# Patient Record
Sex: Male | Born: 2000
Health system: Southern US, Community
[De-identification: ages and names within clinical notes are randomized; demographics above are authoritative.]

---
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
## 2021-11-02 ENCOUNTER — Encounter: Payer: Self-pay | Admitting: Family Medicine

## 2021-11-02 ENCOUNTER — Ambulatory Visit (INDEPENDENT_AMBULATORY_CARE_PROVIDER_SITE_OTHER): Payer: 59 | Admitting: Family Medicine

## 2021-11-02 VITALS — BP 98/68 | HR 82 | Temp 98.0°F | Ht 70.0 in | Wt 130.1 lb

## 2021-11-02 DIAGNOSIS — Z0000 Encounter for general adult medical examination without abnormal findings: Secondary | ICD-10-CM | POA: Diagnosis not present

## 2021-11-02 LAB — CBC WITH DIFFERENTIAL/PLATELET
Basophils Absolute: 0 10*3/uL (ref 0.0–0.1)
Basophils Relative: 0.4 % (ref 0.0–3.0)
Eosinophils Absolute: 0.1 10*3/uL (ref 0.0–0.7)
Eosinophils Relative: 1.3 % (ref 0.0–5.0)
HCT: 45.8 % (ref 36.0–49.0)
Hemoglobin: 14.9 g/dL (ref 12.0–16.0)
Lymphocytes Relative: 53.6 % — ABNORMAL HIGH (ref 24.0–48.0)
Lymphs Abs: 2.4 10*3/uL (ref 0.7–4.0)
MCHC: 32.5 g/dL (ref 31.0–37.0)
MCV: 82.6 fl (ref 78.0–98.0)
Monocytes Absolute: 0.6 10*3/uL (ref 0.1–1.0)
Monocytes Relative: 13.4 % — ABNORMAL HIGH (ref 3.0–12.0)
Neutro Abs: 1.4 10*3/uL (ref 1.4–7.7)
Neutrophils Relative %: 31.3 % — ABNORMAL LOW (ref 43.0–71.0)
Platelets: 219 10*3/uL (ref 150.0–575.0)
RBC: 5.55 Mil/uL (ref 3.80–5.70)
RDW: 13.8 % (ref 11.4–15.5)
WBC: 4.4 10*3/uL — ABNORMAL LOW (ref 4.5–13.5)

## 2021-11-02 LAB — HEPATIC FUNCTION PANEL
ALT: 21 U/L (ref 0–53)
AST: 28 U/L (ref 0–37)
Albumin: 4.7 g/dL (ref 3.5–5.2)
Alkaline Phosphatase: 102 U/L (ref 52–171)
Bilirubin, Direct: 0.1 mg/dL (ref 0.0–0.3)
Total Bilirubin: 0.6 mg/dL (ref 0.2–1.2)
Total Protein: 7.9 g/dL (ref 6.0–8.3)

## 2021-11-02 LAB — BASIC METABOLIC PANEL
BUN: 13 mg/dL (ref 6–23)
CO2: 27 mEq/L (ref 19–32)
Calcium: 9.8 mg/dL (ref 8.4–10.5)
Chloride: 104 mEq/L (ref 96–112)
Creatinine, Ser: 0.99 mg/dL (ref 0.40–1.50)
GFR: 110.4 mL/min (ref >60.00)
Glucose, Bld: 79 mg/dL (ref 70–99)
Potassium: 4.2 mEq/L (ref 3.5–5.1)
Sodium: 139 mEq/L (ref 135–145)

## 2021-11-02 LAB — LIPID PANEL
Cholesterol: 170 mg/dL (ref 0–200)
HDL: 44.4 mg/dL (ref >39.00)
LDL Cholesterol: 112 mg/dL — ABNORMAL HIGH (ref 0–99)
NonHDL: 125.42
Total CHOL/HDL Ratio: 4
Triglycerides: 67 mg/dL (ref 0.0–149.0)
VLDL: 13.4 mg/dL (ref 0.0–40.0)

## 2021-11-02 LAB — TSH: TSH: 1.82 u[IU]/mL (ref 0.40–5.00)

## 2021-11-02 LAB — HEMOGLOBIN A1C: Hgb A1c MFr Bld: 5.8 % (ref 4.6–6.5)

## 2021-11-02 NOTE — Progress Notes (Signed)
   Subjective:    Patient ID: Jay Rollins, male    DOB: Jan 18, 2001, 21 y.o.   MRN: [id number]  HPI Here for a well exam. He os doing well. He is home for the summer. He attends college in [place] state.   Review of Systems  Constitutional: Negative.   HENT: Negative.    Eyes: Negative.   Respiratory: Negative.    Cardiovascular: Negative.   Gastrointestinal: Negative.   Genitourinary: Negative.   Musculoskeletal: Negative.   Skin: Negative.   Neurological: Negative.   Psychiatric/Behavioral: Negative.         Objective:   Physical Exam Constitutional:      General: He is not in acute distress.    Appearance: Normal appearance. He is well-developed. He is not diaphoretic.  HENT:     Head: Normocephalic and atraumatic.     Right Ear: External ear normal.     Left Ear: External ear normal.     Nose: Nose normal.     Mouth/Throat:     Pharynx: No oropharyngeal exudate.  Eyes:     General: No scleral icterus.       Right eye: No discharge.        Left eye: No discharge.     Conjunctiva/sclera: Conjunctivae normal.     Pupils: Pupils are equal, round, and reactive to light.  Neck:     Thyroid: No thyromegaly.     Vascular: No JVD.     Trachea: No tracheal deviation.  Cardiovascular:     Rate and Rhythm: Normal rate and regular rhythm.     Heart sounds: Normal heart sounds. No murmur heard.    No friction rub. No gallop.  Pulmonary:     Effort: Pulmonary effort is normal. No respiratory distress.     Breath sounds: Normal breath sounds. No wheezing or rales.  Chest:     Chest wall: No tenderness.  Abdominal:     General: Bowel sounds are normal. There is no distension.     Palpations: Abdomen is soft. There is no mass.     Tenderness: There is no abdominal tenderness. There is no guarding or rebound.  Genitourinary:    Penis: Normal. No tenderness.      Testes: Normal.  Musculoskeletal:        General: No tenderness. Normal range of motion.      Cervical back: Neck supple.  Lymphadenopathy:     Cervical: No cervical adenopathy.  Skin:    General: Skin is warm and dry.     Coloration: Skin is not pale.     Findings: No erythema or rash.  Neurological:     Mental Status: He is alert and oriented to person, place, and time.     Cranial Nerves: No cranial nerve deficit.     Motor: No abnormal muscle tone.     Coordination: Coordination normal.     Deep Tendon Reflexes: Reflexes are normal and symmetric. Reflexes normal.  Psychiatric:        Behavior: Behavior normal.        Thought Content: Thought content normal.        Judgment: Judgment normal.           Assessment & Plan:  Well exam. We discussed diet an exercise. Get fasting labs. Gershon Crane, MD

## 2021-12-29 DIAGNOSIS — H5213 Myopia, bilateral: Secondary | ICD-10-CM | POA: Diagnosis not present

## 2022-01-08 ENCOUNTER — Ambulatory Visit: Payer: 59 | Admitting: Family Medicine

## 2022-04-26 ENCOUNTER — Other Ambulatory Visit (HOSPITAL_COMMUNITY): Payer: Self-pay

## 2022-04-26 ENCOUNTER — Ambulatory Visit: Payer: 59 | Admitting: Family Medicine

## 2022-04-26 VITALS — BP 118/76 | HR 70 | Temp 97.8°F | Wt 131.6 lb

## 2022-04-26 DIAGNOSIS — H6123 Impacted cerumen, bilateral: Secondary | ICD-10-CM

## 2022-04-26 DIAGNOSIS — H6122 Impacted cerumen, left ear: Secondary | ICD-10-CM

## 2022-04-26 DIAGNOSIS — H9202 Otalgia, left ear: Secondary | ICD-10-CM

## 2022-04-26 MED ORDER — ACETIC ACID 2 % OT SOLN
6.0000 [drp] | OTIC | 0 refills | Status: AC
  Filled 2022-04-26: qty 15, 3d supply, fill #0

## 2022-04-26 NOTE — Progress Notes (Unsigned)
   Assessment/Plan:   Problem List Items Addressed This Visit   None      Subjective:  HPI:  Jay Rollins is a 21 y.o. male who has ALLERGY, ENVIRONMENTAL; Abdominal pain, chronic, epigastric; and Osgood-Schlatter's disease, left on their problem list..   He  has no past medical history on file.Jay Rollins   He presents with chief complaint of Ear Pain (Left ear discomfort and muffled hearing x 2 days. ) .   Ear Pain: Patient presents with {left/right/bi:30031} ear pain.  Symptoms include {otitis symptoms:327}. Symptoms began {numbers; 0-10:33138} {unit:11} ago and are {course:17::"unchanged"} since that time. Patient denies {respiratory symptoms:16811}. Ear history: {numbers; 0-10:33138} previous ear infections.  Idea having the headaches with that her skin is only here  No past surgical history on file.  Outpatient Medications Prior to Visit  Medication Sig Dispense Refill   Multiple Vitamin (MULTIVITAMIN) capsule Take 1 capsule by mouth daily.     No facility-administered medications prior to visit.    Family History  Problem Relation Age of Onset   Hyperlipidemia Other    Hypertension Other     Social History   Socioeconomic History   Marital status: Single    Spouse name: Not on file   Number of children: Not on file   Years of education: Not on file   Highest education level: Not on file  Occupational History   Not on file  Tobacco Use   Smoking status: Never   Smokeless tobacco: Never  Substance and Sexual Activity   Alcohol use: Not on file   Drug use: Not on file   Sexual activity: Not on file  Other Topics Concern   Not on file  Social History Narrative   Not on file   Social Determinants of Health   Financial Resource Strain: Not on file  Food Insecurity: Not on file  Transportation Needs: Not on file  Physical Activity: Not on file  Stress: Not on file  Social Connections: Not on file  Intimate Partner Violence: Not on file                                                                                                  Objective:  Physical Exam: BP 118/76 (BP Location: Left Arm, Patient Position: Sitting, Cuff Size: Large)   Pulse 70   Temp 97.8 F (36.6 C) (Temporal)   Wt 131 lb 9.6 oz (59.7 kg)   SpO2 99%   BMI 18.88 kg/m    ***General: No acute distress. Awake and conversant.  Eyes: Normal conjunctiva, anicteric. Round symmetric pupils.  ENT: Hearing grossly intact. No nasal discharge.  Neck: Neck is supple. No masses or thyromegaly.  Respiratory: Respirations are non-labored. No auditory wheezing.  Skin: Warm. No rashes or ulcers.  Psych: Alert and oriented. Cooperative, Appropriate mood and affect, Normal judgment.  CV: No cyanosis or JVD MSK: Normal ambulation. No clubbing  Neuro: Sensation and CN II-XII grossly normal.        Garner Nash, MD, MS

## 2022-05-10 ENCOUNTER — Other Ambulatory Visit (HOSPITAL_COMMUNITY): Payer: Self-pay

## 2022-11-19 ENCOUNTER — Encounter: Payer: Commercial Managed Care - PPO | Admitting: Family Medicine

## 2022-11-28 ENCOUNTER — Encounter: Payer: Self-pay | Admitting: Family Medicine

## 2022-11-28 ENCOUNTER — Ambulatory Visit (INDEPENDENT_AMBULATORY_CARE_PROVIDER_SITE_OTHER): Payer: Commercial Managed Care - PPO | Admitting: Family Medicine

## 2022-11-28 VITALS — BP 98/62 | HR 78 | Temp 98.1°F | Ht 69.5 in | Wt 125.0 lb

## 2022-11-28 DIAGNOSIS — K589 Irritable bowel syndrome without diarrhea: Secondary | ICD-10-CM | POA: Insufficient documentation

## 2022-11-28 DIAGNOSIS — Z0000 Encounter for general adult medical examination without abnormal findings: Secondary | ICD-10-CM | POA: Diagnosis not present

## 2022-11-28 LAB — LIPID PANEL
Cholesterol: 288 mg/dL — ABNORMAL HIGH (ref 0–200)
HDL: 43.7 mg/dL (ref >39.00)
LDL Cholesterol: 231 mg/dL — ABNORMAL HIGH (ref 0–99)
NonHDL: 244.36
Total CHOL/HDL Ratio: 7
Triglycerides: 68 mg/dL (ref 0.0–149.0)
VLDL: 13.6 mg/dL (ref 0.0–40.0)

## 2022-11-28 LAB — TSH: TSH: 1.6 u[IU]/mL (ref 0.35–5.50)

## 2022-11-28 LAB — CBC WITH DIFFERENTIAL/PLATELET
Basophils Absolute: 0 10*3/uL (ref 0.0–0.1)
Basophils Relative: 0.5 % (ref 0.0–3.0)
Eosinophils Absolute: 0.1 10*3/uL (ref 0.0–0.7)
Eosinophils Relative: 1.6 % (ref 0.0–5.0)
HCT: 45 % (ref 39.0–52.0)
Hemoglobin: 14.7 g/dL (ref 13.0–17.0)
Lymphocytes Relative: 56.5 % — ABNORMAL HIGH (ref 12.0–46.0)
Lymphs Abs: 2.6 10*3/uL (ref 0.7–4.0)
MCHC: 32.6 g/dL (ref 30.0–36.0)
MCV: 81.6 fl (ref 78.0–100.0)
Monocytes Absolute: 0.2 10*3/uL (ref 0.1–1.0)
Monocytes Relative: 5.4 % (ref 3.0–12.0)
Neutro Abs: 1.6 10*3/uL (ref 1.4–7.7)
Neutrophils Relative %: 36 % — ABNORMAL LOW (ref 43.0–77.0)
Platelets: 222 10*3/uL (ref 150.0–400.0)
RBC: 5.52 Mil/uL (ref 4.22–5.81)
RDW: 13.1 % (ref 11.5–14.6)
WBC: 4.6 10*3/uL (ref 4.5–10.5)

## 2022-11-28 LAB — BASIC METABOLIC PANEL
BUN: 18 mg/dL (ref 6–23)
CO2: 25 mEq/L (ref 19–32)
Calcium: 10.4 mg/dL (ref 8.4–10.5)
Chloride: 106 mEq/L (ref 96–112)
Creatinine, Ser: 0.99 mg/dL (ref 0.40–1.50)
GFR: 109.57 mL/min (ref >60.00)
Glucose, Bld: 92 mg/dL (ref 70–99)
Potassium: 4.1 mEq/L (ref 3.5–5.1)
Sodium: 139 mEq/L (ref 135–145)

## 2022-11-28 LAB — HEMOGLOBIN A1C: Hgb A1c MFr Bld: 5.9 % (ref 4.6–6.5)

## 2022-11-28 LAB — HEPATIC FUNCTION PANEL
ALT: 18 U/L (ref 0–53)
AST: 18 U/L (ref 0–37)
Albumin: 4.9 g/dL (ref 3.5–5.2)
Alkaline Phosphatase: 57 U/L (ref 39–117)
Bilirubin, Direct: 0.1 mg/dL (ref 0.0–0.3)
Total Bilirubin: 0.4 mg/dL (ref 0.2–1.2)
Total Protein: 8 g/dL (ref 6.0–8.3)

## 2022-11-28 NOTE — Progress Notes (Signed)
Subjective:    Patient ID: Jay Rollins, male    DOB: 2001-05-18, 21 y.o.   MRN: [id number]  HPI Here for a well exam. He is spending the summer with his parents. 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 His only complaint is GI issues that started about 3 months ago. He often feels bloated after he eats a meal, and he passes a lot of gas per rectum. No heartburn or nausea or pain. He averages 1-2 BM's every day.    Review of Systems  Constitutional: Negative.   HENT: Negative.    Eyes: Negative.   Respiratory: Negative.    Cardiovascular: Negative.   Gastrointestinal:  Positive for abdominal distention. Negative for abdominal pain, blood in stool, constipation, diarrhea, nausea and vomiting.  Genitourinary: Negative.   Musculoskeletal: Negative.   Skin: Negative.   Neurological: Negative.   Psychiatric/Behavioral: Negative.         Objective:   Physical Exam Constitutional:      General: He is not in acute distress.    Appearance: Normal appearance. He is well-developed. He is not diaphoretic.  HENT:     Head: Normocephalic and atraumatic.     Right Ear: External ear normal.     Left Ear: External ear normal.     Nose: Nose normal.     Mouth/Throat:     Pharynx: No oropharyngeal exudate.  Eyes:     General: No scleral icterus.       Right eye: No discharge.        Left eye: No discharge.     Conjunctiva/sclera: Conjunctivae normal.     Pupils: Pupils are equal, round, and reactive to light.  Neck:     Thyroid: No thyromegaly.     Vascular: No JVD.     Trachea: No tracheal deviation.  Cardiovascular:     Rate and Rhythm: Normal rate and regular rhythm.     Pulses: Normal pulses.     Heart sounds: Normal heart sounds. No murmur heard.    No friction rub. No gallop.  Pulmonary:     Effort: Pulmonary effort is normal. No respiratory distress.     Breath sounds: Normal breath sounds. No  wheezing or rales.  Chest:     Chest wall: No tenderness.  Abdominal:     General: Bowel sounds are normal. There is no distension.     Palpations: Abdomen is soft. There is no mass.     Tenderness: There is no abdominal tenderness. There is no guarding or rebound.  Genitourinary:    Penis: Normal. No tenderness.      Testes: Normal.  Musculoskeletal:        General: No tenderness. Normal range of motion.     Cervical back: Neck supple.  Lymphadenopathy:     Cervical: No cervical adenopathy.  Skin:    General: Skin is warm and dry.     Coloration: Skin is not pale.     Findings: No erythema or rash.  Neurological:     General: No focal deficit present.     Mental Status: He is alert and oriented to person, place, and time.     Cranial Nerves: No cranial nerve deficit.     Motor: No abnormal muscle tone.     Coordination: Coordination normal.     Deep Tendon Reflexes: Reflexes are normal and symmetric. Reflexes normal.  Psychiatric:  Mood and Affect: Mood normal.        Behavior: Behavior normal.        Thought Content: Thought content normal.        Judgment: Judgment normal.           Assessment & Plan:  Well exam. We discussed diet and exercise. Get fasting labs. He has some symptoms consistent with IBS. We talked about foods to avoid (beans, broccoli, onions, etc). He can try taking Omeprazole 20 mg daily, and he can take a probiotic like Align daily.  Gershon Crane, MD

## 2023-01-10 DIAGNOSIS — H5213 Myopia, bilateral: Secondary | ICD-10-CM | POA: Diagnosis not present

## 2023-04-15 ENCOUNTER — Ambulatory Visit (INDEPENDENT_AMBULATORY_CARE_PROVIDER_SITE_OTHER): Payer: Commercial Managed Care - PPO

## 2023-04-15 DIAGNOSIS — Z23 Encounter for immunization: Secondary | ICD-10-CM

## 2023-12-16 ENCOUNTER — Encounter: Payer: Self-pay | Admitting: Family Medicine

## 2023-12-16 ENCOUNTER — Ambulatory Visit (INDEPENDENT_AMBULATORY_CARE_PROVIDER_SITE_OTHER): Admitting: Family Medicine

## 2023-12-16 VITALS — BP 98/62 | HR 71 | Temp 97.6°F | Ht 70.0 in | Wt 128.4 lb

## 2023-12-16 DIAGNOSIS — Z0000 Encounter for general adult medical examination without abnormal findings: Secondary | ICD-10-CM

## 2023-12-16 DIAGNOSIS — Z23 Encounter for immunization: Secondary | ICD-10-CM

## 2023-12-16 LAB — LIPID PANEL
Cholesterol: 250 mg/dL — ABNORMAL HIGH (ref 0–200)
HDL: 54.1 mg/dL (ref >39.00)
LDL Cholesterol: 183 mg/dL — ABNORMAL HIGH (ref 0–99)
NonHDL: 196.22
Total CHOL/HDL Ratio: 5
Triglycerides: 67 mg/dL (ref 0.0–149.0)
VLDL: 13.4 mg/dL (ref 0.0–40.0)

## 2023-12-16 LAB — BASIC METABOLIC PANEL WITH GFR
BUN: 13 mg/dL (ref 6–23)
CO2: 26 meq/L (ref 19–32)
Calcium: 10.3 mg/dL (ref 8.4–10.5)
Chloride: 102 meq/L (ref 96–112)
Creatinine, Ser: 1.08 mg/dL (ref 0.40–1.50)
GFR: 97.98 mL/min (ref >60.00)
Glucose, Bld: 83 mg/dL (ref 70–99)
Potassium: 4.8 meq/L (ref 3.5–5.1)
Sodium: 139 meq/L (ref 135–145)

## 2023-12-16 LAB — CBC WITH DIFFERENTIAL/PLATELET
Basophils Absolute: 0 K/uL (ref 0.0–0.1)
Basophils Relative: 0.3 % (ref 0.0–3.0)
Eosinophils Absolute: 0 K/uL (ref 0.0–0.7)
Eosinophils Relative: 0.9 % (ref 0.0–5.0)
HCT: 46.3 % (ref 39.0–52.0)
Hemoglobin: 15.1 g/dL (ref 13.0–17.0)
Lymphocytes Relative: 58.2 % — ABNORMAL HIGH (ref 12.0–46.0)
Lymphs Abs: 2.4 K/uL (ref 0.7–4.0)
MCHC: 32.5 g/dL (ref 30.0–36.0)
MCV: 81.8 fl (ref 78.0–100.0)
Monocytes Absolute: 0.3 K/uL (ref 0.1–1.0)
Monocytes Relative: 6.5 % (ref 3.0–12.0)
Neutro Abs: 1.4 K/uL (ref 1.4–7.7)
Neutrophils Relative %: 34.1 % — ABNORMAL LOW (ref 43.0–77.0)
Platelets: 233 K/uL (ref 150.0–400.0)
RBC: 5.66 Mil/uL (ref 4.22–5.81)
RDW: 13.4 % (ref 11.5–15.5)
WBC: 4.2 K/uL (ref 4.0–10.5)

## 2023-12-16 LAB — HEPATIC FUNCTION PANEL
ALT: 18 U/L (ref 0–53)
AST: 19 U/L (ref 0–37)
Albumin: 5.1 g/dL (ref 3.5–5.2)
Alkaline Phosphatase: 72 U/L (ref 39–117)
Bilirubin, Direct: 0.1 mg/dL (ref 0.0–0.3)
Total Bilirubin: 0.6 mg/dL (ref 0.2–1.2)
Total Protein: 7.9 g/dL (ref 6.0–8.3)

## 2023-12-16 LAB — HEMOGLOBIN A1C: Hgb A1c MFr Bld: 6.1 % (ref 4.6–6.5)

## 2023-12-16 LAB — TSH: TSH: 1.93 u[IU]/mL (ref 0.35–5.50)

## 2023-12-16 NOTE — Progress Notes (Signed)
   Subjective:    Patient ID: Jay Rollins, male    DOB: 27-Feb-2001, 23 y.o.   MRN: [id number]  HPI Here for a well exam. He feels great.    Review of Systems  Constitutional: Negative.   HENT: Negative.    Eyes: Negative.   Respiratory: Negative.    Cardiovascular: Negative.   Gastrointestinal: Negative.   Genitourinary: Negative.   Musculoskeletal: Negative.   Skin: Negative.   Neurological: Negative.   Psychiatric/Behavioral: Negative.         Objective:   Physical Exam Constitutional:      General: He is not in acute distress.    Appearance: Normal appearance. He is well-developed. He is not diaphoretic.  HENT:     Head: Normocephalic and atraumatic.     Right Ear: External ear normal.     Left Ear: External ear normal.     Nose: Nose normal.     Mouth/Throat:     Pharynx: No oropharyngeal exudate.  Eyes:     General: No scleral icterus.       Right eye: No discharge.        Left eye: No discharge.     Conjunctiva/sclera: Conjunctivae normal.     Pupils: Pupils are equal, round, and reactive to light.  Neck:     Thyroid : No thyromegaly.     Vascular: No JVD.     Trachea: No tracheal deviation.  Cardiovascular:     Rate and Rhythm: Normal rate and regular rhythm.     Pulses: Normal pulses.     Heart sounds: Normal heart sounds. No murmur heard.    No friction rub. No gallop.  Pulmonary:     Effort: Pulmonary effort is normal. No respiratory distress.     Breath sounds: Normal breath sounds. No wheezing or rales.  Chest:     Chest wall: No tenderness.  Abdominal:     General: Bowel sounds are normal. There is no distension.     Palpations: Abdomen is soft. There is no mass.     Tenderness: There is no abdominal tenderness. There is no guarding or rebound.  Genitourinary:    Penis: Normal. No tenderness.      Testes: Normal.  Musculoskeletal:        General: No tenderness. Normal range of motion.     Cervical back: Neck supple.   Lymphadenopathy:     Cervical: No cervical adenopathy.  Skin:    General: Skin is warm and dry.     Coloration: Skin is not pale.     Findings: No erythema or rash.  Neurological:     General: No focal deficit present.     Mental Status: He is alert and oriented to person, place, and time.     Cranial Nerves: No cranial nerve deficit.     Motor: No abnormal muscle tone.     Coordination: Coordination normal.     Deep Tendon Reflexes: Reflexes are normal and symmetric. Reflexes normal.  Psychiatric:        Mood and Affect: Mood normal.        Behavior: Behavior normal.        Thought Content: Thought content normal.        Judgment: Judgment normal.           Assessment & Plan:  Well exam. We discussed diet and exercise. Get fasting labs. Garnette Olmsted, MD

## 2023-12-18 ENCOUNTER — Ambulatory Visit: Payer: Self-pay | Admitting: Family Medicine

## 2024-01-12 DIAGNOSIS — H5213 Myopia, bilateral: Secondary | ICD-10-CM | POA: Diagnosis not present

## 2024-01-12 DIAGNOSIS — H52223 Regular astigmatism, bilateral: Secondary | ICD-10-CM | POA: Diagnosis not present

## 2024-04-20 ENCOUNTER — Other Ambulatory Visit (HOSPITAL_COMMUNITY): Payer: Self-pay

## 2024-04-20 MED ORDER — AZITHROMYCIN 500 MG PO TABS
500.0000 mg | ORAL_TABLET | Freq: Every day | ORAL | 0 refills | Status: AC
  Filled 2024-04-20: qty 4, 3d supply, fill #0
